# Patient Record
Sex: Female | Born: 1970 | Race: Black or African American | Hispanic: No | Marital: Single | Smoking: Never smoker
Health system: Southern US, Community
[De-identification: ages and names within clinical notes are randomized; demographics above are authoritative.]

## PROBLEM LIST (undated history)

## (undated) HISTORY — PX: TUBAL LIGATION: SHX77

---

## 1997-02-26 ENCOUNTER — Ambulatory Visit (HOSPITAL_COMMUNITY): Admission: RE | Admit: 1997-02-26 | Discharge: 1997-02-26 | Payer: Self-pay | Admitting: *Deleted

## 1997-06-23 ENCOUNTER — Emergency Department (HOSPITAL_COMMUNITY): Admission: EM | Admit: 1997-06-23 | Discharge: 1997-06-23 | Payer: Self-pay | Admitting: *Deleted

## 1997-09-21 ENCOUNTER — Ambulatory Visit (HOSPITAL_COMMUNITY): Admission: RE | Admit: 1997-09-21 | Discharge: 1997-09-21 | Payer: Self-pay | Admitting: *Deleted

## 1997-10-13 ENCOUNTER — Emergency Department (HOSPITAL_COMMUNITY): Admission: EM | Admit: 1997-10-13 | Discharge: 1997-10-13 | Payer: Self-pay | Admitting: Emergency Medicine

## 1999-01-08 ENCOUNTER — Encounter: Admission: RE | Admit: 1999-01-08 | Discharge: 1999-01-08 | Payer: Self-pay | Admitting: Family Medicine

## 1999-01-08 ENCOUNTER — Other Ambulatory Visit: Admission: RE | Admit: 1999-01-08 | Discharge: 1999-02-04 | Payer: Self-pay | Admitting: *Deleted

## 1999-01-22 ENCOUNTER — Encounter: Admission: RE | Admit: 1999-01-22 | Discharge: 1999-01-22 | Payer: Self-pay | Admitting: Family Medicine

## 1999-06-04 ENCOUNTER — Encounter: Admission: RE | Admit: 1999-06-04 | Discharge: 1999-06-04 | Payer: Self-pay | Admitting: Family Medicine

## 1999-06-05 ENCOUNTER — Encounter: Payer: Self-pay | Admitting: Sports Medicine

## 1999-06-05 ENCOUNTER — Encounter: Admission: RE | Admit: 1999-06-05 | Discharge: 1999-06-05 | Payer: Self-pay | Admitting: *Deleted

## 1999-11-25 ENCOUNTER — Encounter: Admission: RE | Admit: 1999-11-25 | Discharge: 1999-11-25 | Payer: Self-pay | Admitting: Sports Medicine

## 2000-03-11 ENCOUNTER — Encounter: Admission: RE | Admit: 2000-03-11 | Discharge: 2000-03-11 | Payer: Self-pay | Admitting: Family Medicine

## 2000-03-11 ENCOUNTER — Other Ambulatory Visit: Admission: RE | Admit: 2000-03-11 | Discharge: 2000-03-11 | Payer: Self-pay | Admitting: Family Medicine

## 2000-10-26 ENCOUNTER — Other Ambulatory Visit: Admission: RE | Admit: 2000-10-26 | Discharge: 2000-10-26 | Payer: Self-pay | Admitting: Family Medicine

## 2000-10-27 ENCOUNTER — Encounter: Admission: RE | Admit: 2000-10-27 | Discharge: 2000-10-27 | Payer: Self-pay | Admitting: Family Medicine

## 2001-02-09 ENCOUNTER — Encounter: Admission: RE | Admit: 2001-02-09 | Discharge: 2001-02-09 | Payer: Self-pay | Admitting: Family Medicine

## 2001-04-15 ENCOUNTER — Encounter: Admission: RE | Admit: 2001-04-15 | Discharge: 2001-04-15 | Payer: Self-pay | Admitting: Family Medicine

## 2001-04-21 ENCOUNTER — Encounter: Admission: RE | Admit: 2001-04-21 | Discharge: 2001-04-21 | Payer: Self-pay | Admitting: Family Medicine

## 2001-05-26 ENCOUNTER — Encounter: Admission: RE | Admit: 2001-05-26 | Discharge: 2001-05-26 | Payer: Self-pay | Admitting: Family Medicine

## 2002-01-30 ENCOUNTER — Other Ambulatory Visit: Admission: RE | Admit: 2002-01-30 | Discharge: 2002-01-30 | Payer: Self-pay | Admitting: Family Medicine

## 2002-01-30 ENCOUNTER — Encounter: Admission: RE | Admit: 2002-01-30 | Discharge: 2002-01-30 | Payer: Self-pay | Admitting: Family Medicine

## 2002-02-10 ENCOUNTER — Encounter: Admission: RE | Admit: 2002-02-10 | Discharge: 2002-02-10 | Payer: Self-pay | Admitting: Sports Medicine

## 2002-02-10 ENCOUNTER — Encounter: Payer: Self-pay | Admitting: Sports Medicine

## 2002-05-10 ENCOUNTER — Encounter: Admission: RE | Admit: 2002-05-10 | Discharge: 2002-05-10 | Payer: Self-pay | Admitting: Sports Medicine

## 2002-08-21 ENCOUNTER — Encounter: Admission: RE | Admit: 2002-08-21 | Discharge: 2002-08-21 | Payer: Self-pay | Admitting: Sports Medicine

## 2002-10-05 ENCOUNTER — Encounter: Admission: RE | Admit: 2002-10-05 | Discharge: 2002-10-05 | Payer: Self-pay | Admitting: Family Medicine

## 2002-11-14 ENCOUNTER — Encounter: Admission: RE | Admit: 2002-11-14 | Discharge: 2002-11-14 | Payer: Self-pay | Admitting: Sports Medicine

## 2002-12-29 ENCOUNTER — Encounter: Admission: RE | Admit: 2002-12-29 | Discharge: 2002-12-29 | Payer: Self-pay | Admitting: Sports Medicine

## 2003-03-07 ENCOUNTER — Emergency Department (HOSPITAL_COMMUNITY): Admission: EM | Admit: 2003-03-07 | Discharge: 2003-03-07 | Payer: Self-pay | Admitting: Family Medicine

## 2004-02-10 ENCOUNTER — Emergency Department (HOSPITAL_COMMUNITY): Admission: EM | Admit: 2004-02-10 | Discharge: 2004-02-10 | Payer: Self-pay | Admitting: Family Medicine

## 2004-02-11 ENCOUNTER — Emergency Department (HOSPITAL_COMMUNITY): Admission: EM | Admit: 2004-02-11 | Discharge: 2004-02-11 | Payer: Self-pay | Admitting: Emergency Medicine

## 2005-06-26 ENCOUNTER — Ambulatory Visit: Payer: Self-pay | Admitting: Family Medicine

## 2005-06-29 ENCOUNTER — Encounter: Admission: RE | Admit: 2005-06-29 | Discharge: 2005-06-29 | Payer: Self-pay | Admitting: Sports Medicine

## 2005-06-29 ENCOUNTER — Ambulatory Visit: Payer: Self-pay | Admitting: Family Medicine

## 2005-08-12 ENCOUNTER — Encounter (INDEPENDENT_AMBULATORY_CARE_PROVIDER_SITE_OTHER): Payer: Self-pay | Admitting: *Deleted

## 2005-08-25 ENCOUNTER — Other Ambulatory Visit: Admission: RE | Admit: 2005-08-25 | Discharge: 2005-08-25 | Payer: Self-pay | Admitting: Family Medicine

## 2005-08-25 ENCOUNTER — Ambulatory Visit: Payer: Self-pay | Admitting: Sports Medicine

## 2006-03-11 DIAGNOSIS — L708 Other acne: Secondary | ICD-10-CM | POA: Insufficient documentation

## 2006-03-11 DIAGNOSIS — D259 Leiomyoma of uterus, unspecified: Secondary | ICD-10-CM | POA: Insufficient documentation

## 2006-03-11 DIAGNOSIS — F17200 Nicotine dependence, unspecified, uncomplicated: Secondary | ICD-10-CM | POA: Insufficient documentation

## 2006-03-12 ENCOUNTER — Encounter (INDEPENDENT_AMBULATORY_CARE_PROVIDER_SITE_OTHER): Payer: Self-pay | Admitting: *Deleted

## 2007-01-04 ENCOUNTER — Encounter: Payer: Self-pay | Admitting: *Deleted

## 2007-01-31 ENCOUNTER — Encounter: Payer: Self-pay | Admitting: *Deleted

## 2007-12-15 ENCOUNTER — Emergency Department (HOSPITAL_COMMUNITY): Admission: EM | Admit: 2007-12-15 | Discharge: 2007-12-15 | Payer: Self-pay | Admitting: Emergency Medicine

## 2008-06-22 ENCOUNTER — Emergency Department (HOSPITAL_COMMUNITY): Admission: EM | Admit: 2008-06-22 | Discharge: 2008-06-22 | Payer: Self-pay | Admitting: Emergency Medicine

## 2008-12-04 ENCOUNTER — Encounter: Payer: Self-pay | Admitting: Family Medicine

## 2009-04-04 ENCOUNTER — Encounter: Payer: Self-pay | Admitting: *Deleted

## 2009-08-07 ENCOUNTER — Ambulatory Visit: Payer: Self-pay | Admitting: Obstetrics and Gynecology

## 2009-08-08 ENCOUNTER — Encounter: Admission: RE | Admit: 2009-08-08 | Discharge: 2009-08-08 | Payer: Self-pay | Admitting: Family Medicine

## 2009-08-08 ENCOUNTER — Encounter: Payer: Self-pay | Admitting: Obstetrics and Gynecology

## 2009-08-08 LAB — CONVERTED CEMR LAB
HCT: 40.4 % (ref 36.0–46.0)
Hemoglobin: 13 g/dL (ref 12.0–15.0)
MCHC: 32.2 g/dL (ref 30.0–36.0)
MCV: 91.4 fL (ref 78.0–100.0)
RBC: 4.42 M/uL (ref 3.87–5.11)
WBC: 4.1 10*3/uL (ref 4.0–10.5)

## 2010-02-02 ENCOUNTER — Encounter: Payer: Self-pay | Admitting: Family Medicine

## 2010-02-11 NOTE — Miscellaneous (Signed)
Summary: DO NOT RESCHEDULE!!   Clinical Lists Changes

## 2010-10-17 LAB — POCT URINALYSIS DIP (DEVICE)
Ketones, ur: 15 mg/dL — AB
Specific Gravity, Urine: 1.025 (ref 1.005–1.030)
pH: 6.5 (ref 5.0–8.0)

## 2014-11-16 ENCOUNTER — Emergency Department (HOSPITAL_BASED_OUTPATIENT_CLINIC_OR_DEPARTMENT_OTHER)
Admission: EM | Admit: 2014-11-16 | Discharge: 2014-11-16 | Disposition: A | Discharge status: Home / Self Care | Payer: No Typology Code available for payment source | Attending: Emergency Medicine | Admitting: Emergency Medicine

## 2014-11-16 ENCOUNTER — Encounter (HOSPITAL_BASED_OUTPATIENT_CLINIC_OR_DEPARTMENT_OTHER): Payer: Self-pay | Admitting: *Deleted

## 2014-11-16 DIAGNOSIS — Z880 Allergy status to penicillin: Secondary | ICD-10-CM | POA: Insufficient documentation

## 2014-11-16 DIAGNOSIS — H18822 Corneal disorder due to contact lens, left eye: Secondary | ICD-10-CM | POA: Insufficient documentation

## 2014-11-16 MED ORDER — POLYMYXIN B-TRIMETHOPRIM 10000-0.1 UNIT/ML-% OP SOLN: 1.0000 [drp] | OPHTHALMIC | Status: AC

## 2014-11-16 MED ORDER — FLUORESCEIN SODIUM 1 MG OP STRP
1.0000 | ORAL_STRIP | Freq: Once | OPHTHALMIC | Status: AC
  Administered 2014-11-16: 1 via OPHTHALMIC
  Filled 2014-11-16: qty 1

## 2014-11-16 MED ORDER — TETRACAINE HCL 0.5 % OP SOLN
1.0000 [drp] | Freq: Once | OPHTHALMIC | Status: AC
  Administered 2014-11-16: 1 [drp] via OPHTHALMIC

## 2014-11-16 MED ORDER — TETRACAINE HCL 0.5 % OP SOLN
OPHTHALMIC | Status: AC
  Filled 2014-11-16: qty 2

## 2014-11-16 NOTE — ED Notes (Signed)
Pt. Feels she may have had an eye lash in her eye yesterday with her contacts in and after taking her contacts out she messed with her eye now she has irritation and pain in the L eye.

## 2014-11-16 NOTE — ED Provider Notes (Signed)
CSN: [id number]     Arrival date & time 11/16/14  1539 History   First MD Initiated Contact with Patient 11/16/14 1552     Chief complaint: Eye discomfort HPI Patient presents to the emergency room for evaluation of discomfort in her left eye. Patient wears contact lenses. Yesterday she felt like she had an eyelash in her left upper eyelid. She tried to remove the eyelash. Since that time she's had discomfort in the left upper eyelid. Her eye is somewhat red and irritated. She has noticed increased tearing. She denies any difficulty with her vision. Patient continues to wear her contact lenses. No past medical history on file. Past Surgical History  Procedure Laterality Date  . Tubal ligation     No family history on file. Social History  Substance Use Topics  . Smoking status: Not on file  . Smokeless tobacco: Not on file  . Alcohol Use: Not on file   OB History    No data available     Review of Systems  All other systems reviewed and are negative.     Allergies  Dilaudid; Morphine and related; and Penicillins  Home Medications   Prior to Admission medications   Medication Sig Start Date End Date Taking? Authorizing Provider  trimethoprim-polymyxin b (POLYTRIM) ophthalmic solution Place 1 drop into the left eye every 4 (four) hours. 11/16/14   Dorie Rank, MD   BP 159/100 mmHg  Pulse 88  Temp(Src) 98 F (36.7 C) (Oral)  Resp 17  Ht 5\' 8"  (1.727 m)  Wt 157 lb (71.215 kg)  BMI 23.88 kg/m2  SpO2 100%  LMP 10/16/2014 Physical Exam  Constitutional: She appears well-developed and well-nourished. No distress.  HENT:  Head: Normocephalic and atraumatic.  Right Ear: External ear normal.  Left Ear: External ear normal.  Eyes: Conjunctivae and EOM are normal. Right eye exhibits no discharge. Left eye exhibits no discharge. No scleral icterus.  Patient has a retained contact lens in the left upper eyelid, no ulcerations with fluorescein staining, lids were everted and no  foreign body noted other than the contact lens  Neck: Neck supple. No tracheal deviation present.  Cardiovascular: Normal rate.   Pulmonary/Chest: Effort normal. No stridor. No respiratory distress.  Musculoskeletal: She exhibits no edema.  Neurological: She is alert. Cranial nerve deficit: no gross deficits.  Skin: Skin is warm and dry. No rash noted.  Psychiatric: She has a normal mood and affect.  Nursing note and vitals reviewed.   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 It was not centered over her iris. Patient was able to remove her contact lens. I do not see any evidence of ulceration or obvious abrasion. She does not have any evidence of persistent foreign body. Patient's eyes were irrigated. I will discharge her home on antibiotics. I instructed her to not wear her contact lenses for at least the next week. Follow up with her eye doctor.    Dorie Rank, MD 11/16/14 [phone]

## 2016-03-01 ENCOUNTER — Encounter (HOSPITAL_BASED_OUTPATIENT_CLINIC_OR_DEPARTMENT_OTHER): Payer: Self-pay | Admitting: Emergency Medicine

## 2016-03-01 ENCOUNTER — Emergency Department (HOSPITAL_BASED_OUTPATIENT_CLINIC_OR_DEPARTMENT_OTHER)
Admission: EM | Admit: 2016-03-01 | Discharge: 2016-03-01 | Disposition: A | Discharge status: Home / Self Care | Payer: Self-pay | Attending: Physician Assistant | Admitting: Physician Assistant

## 2016-03-01 ENCOUNTER — Emergency Department (HOSPITAL_BASED_OUTPATIENT_CLINIC_OR_DEPARTMENT_OTHER): Payer: Self-pay

## 2016-03-01 DIAGNOSIS — J111 Influenza due to unidentified influenza virus with other respiratory manifestations: Secondary | ICD-10-CM | POA: Insufficient documentation

## 2016-03-01 LAB — COMPREHENSIVE METABOLIC PANEL
ALK PHOS: 114 U/L (ref 38–126)
ALT: 24 U/L (ref 14–54)
AST: 38 U/L (ref 15–41)
Albumin: 4.2 g/dL (ref 3.5–5.0)
Anion gap: 13 (ref 5–15)
BUN: 9 mg/dL (ref 6–20)
CALCIUM: 8.9 mg/dL (ref 8.9–10.3)
CHLORIDE: 104 mmol/L (ref 101–111)
CO2: 17 mmol/L — ABNORMAL LOW (ref 22–32)
CREATININE: 0.86 mg/dL (ref 0.44–1.00)
Glucose, Bld: 113 mg/dL — ABNORMAL HIGH (ref 65–99)
Potassium: 3.5 mmol/L (ref 3.5–5.1)
Sodium: 134 mmol/L — ABNORMAL LOW (ref 135–145)
Total Bilirubin: 0.7 mg/dL (ref 0.3–1.2)
Total Protein: 8.1 g/dL (ref 6.5–8.1)

## 2016-03-01 LAB — URINALYSIS, ROUTINE W REFLEX MICROSCOPIC
Bilirubin Urine: NEGATIVE
GLUCOSE, UA: NEGATIVE mg/dL
KETONES UR: 40 mg/dL — AB
LEUKOCYTES UA: NEGATIVE
Nitrite: NEGATIVE
PROTEIN: NEGATIVE mg/dL
Specific Gravity, Urine: 1.009 (ref 1.005–1.030)
pH: 6 (ref 5.0–8.0)

## 2016-03-01 LAB — CBC
HCT: 41.5 % (ref 36.0–46.0)
Hemoglobin: 13.7 g/dL (ref 12.0–15.0)
MCH: 29.3 pg (ref 26.0–34.0)
MCHC: 33 g/dL (ref 30.0–36.0)
MCV: 88.9 fL (ref 78.0–100.0)
PLATELETS: 181 10*3/uL (ref 150–400)
RBC: 4.67 MIL/uL (ref 3.87–5.11)
RDW: 12.7 % (ref 11.5–15.5)
WBC: 7.9 10*3/uL (ref 4.0–10.5)

## 2016-03-01 LAB — URINALYSIS, MICROSCOPIC (REFLEX): WBC, UA: NONE SEEN WBC/hpf (ref 0–5)

## 2016-03-01 LAB — LIPASE, BLOOD: Lipase: 30 U/L (ref 11–51)

## 2016-03-01 MED ORDER — ONDANSETRON HCL 4 MG/2ML IJ SOLN
4.0000 mg | Freq: Once | INTRAMUSCULAR | Status: AC | PRN
  Administered 2016-03-01: 4 mg via INTRAVENOUS
  Filled 2016-03-01: qty 2

## 2016-03-01 MED ORDER — ACETAMINOPHEN 325 MG PO TABS
650.0000 mg | ORAL_TABLET | Freq: Once | ORAL | Status: AC
  Administered 2016-03-01: 650 mg via ORAL
  Filled 2016-03-01: qty 2

## 2016-03-01 MED ORDER — SODIUM CHLORIDE 0.9 % IV BOLUS (SEPSIS)
1000.0000 mL | Freq: Once | INTRAVENOUS | Status: AC
  Administered 2016-03-01: 1000 mL via INTRAVENOUS

## 2016-03-01 MED ORDER — IBUPROFEN 400 MG PO TABS
600.0000 mg | ORAL_TABLET | Freq: Once | ORAL | Status: AC
  Administered 2016-03-01: 600 mg via ORAL
  Filled 2016-03-01: qty 1

## 2016-03-01 MED ORDER — ONDANSETRON HCL 4 MG PO TABS: 4.0000 mg | ORAL_TABLET | Freq: Three times a day (TID) | ORAL | 0 refills | Status: AC | PRN

## 2016-03-01 NOTE — ED Triage Notes (Addendum)
Patient reports that she has had N/V/D and fever since Thursday  - patient states that her tmax was 104 - Taking tylenol and motrin at home  -  The patient reports that generalized abdominal pian since Thursday

## 2016-03-01 NOTE — ED Notes (Signed)
Pt alert, NAD, calm, interactive, resps e/u, speaking in clear complete sentences, skin W&D, no dyspnea noted, IVF infusing, VSS.

## 2016-03-01 NOTE — ED Notes (Signed)
Up to b/r, steady gait. Alert, NAD, calm, interactive, resps e/u, speaking in clear complete sentences, no dyspnea noted, skin W&D, VSS, (denies: pain, sob, nausea, dizziness or visual changes), EDP into room. Family at [facility].

## 2016-03-01 NOTE — ED Notes (Signed)
EDP at BS 

## 2016-03-01 NOTE — Discharge Instructions (Signed)
We think that you have flew based on your symptoms. Please stay at home, rest, stay hydrated, use Zofran as needed for nausea. Do not return to work until you are better.

## 2016-03-01 NOTE — ED Notes (Signed)
Pt given d/c instructions as per chart. Verbalizes understanding. No questions. Rx x 1 

## 2016-03-01 NOTE — ED Notes (Signed)
Tolerating PO fluids, denies nausea, HA remains, but improving. 8/10.

## 2016-03-01 NOTE — ED Provider Notes (Signed)
[facility] Provider Note   CSN: [id number] Arrival date & time: 03/01/16  1909   By signing my name below, I, Soijett Blue, attest that this documentation has been prepared under the direction and in the presence of Tanelle Lanzo Julio Alm, MD. Electronically Signed: Soijett Blue, ED Scribe. 03/01/16. 7:56 PM.  History   Chief Complaint Chief Complaint  Patient presents with  . Abdominal Pain    HPI Morgan Mosley is a 46 y.o. female who presents to the Emergency Department complaining of generalized abdominal pain onset 3 days ago. Pt reports associated vomiting, nausea, diarrhea, dry cough, max fever of 104, and HA. Pt has tried ibuprofen, sudafed, robitussin, and tylenol with no relief of her symptoms. She denies chills and any other symptoms.  The history is provided by the patient. No language interpreter was used.    History reviewed. No pertinent past medical history.  Patient Active Problem List   Diagnosis Date Noted  . 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 TOBACCO DEPENDENCE 03/11/2006  . 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 TUBAL LIGATION    . TUBAL LIGATION      OB History    No data available       Home Medications    Prior to Admission medications   Medication Sig Start Date End Date Taking? Authorizing Provider  trimethoprim-polymyxin b (POLYTRIM) ophthalmic solution Place 1 drop into the left eye every 4 (four) hours. 11/16/14   Dorie Rank, MD    Family History History reviewed. No pertinent family history.  Social History Social History  Substance Use Topics  . Smoking status: Never Smoker  . Smokeless tobacco: Never Used  . Alcohol use No     Allergies   Dilaudid [hydromorphone]; Morphine and related; and Penicillins   Review of Systems Review of Systems  Constitutional: Positive for fever. Negative for chills.  Respiratory: Positive for cough.   Gastrointestinal: Positive for abdominal pain,  diarrhea, nausea and vomiting.  Neurological: Positive for headaches.     Physical Exam Updated Vital Signs BP 145/98 (BP Location: Right Arm)   Pulse 116   Temp 102.8 F (39.3 C) (Oral)   Resp 22   Ht 5\' 8"  (1.727 m)   Wt 155 lb (70.3 kg)   LMP 03/01/2016   SpO2 98%   BMI 23.57 kg/m   Physical Exam  Constitutional: She is oriented to person, place, and time. She appears well-developed and well-nourished. No distress.  HENT:  Head: Normocephalic and atraumatic.  Eyes: EOM are normal.  Neck: Neck supple.  Cardiovascular: Normal rate, regular rhythm, normal heart sounds and intact distal pulses.  Exam reveals no friction rub.   No murmur heard. Pulmonary/Chest: Effort normal. Tachypnea noted. No respiratory distress. She has no wheezes. She has no rales.  Mildly tachypneic.  Abdominal: Soft. She exhibits no distension. There is no tenderness.  Musculoskeletal: Normal range of motion.  Neurological: She is alert and oriented to person, place, and time.  Skin: Skin is warm and dry.  Psychiatric: She has a normal mood and affect. Her behavior is normal.  Nursing note and vitals reviewed.    ED Treatments / Results  DIAGNOSTIC STUDIES: Oxygen Saturation is 98% on RA, nl by my interpretation.    COORDINATION OF CARE: 7:56 PM Discussed treatment plan with pt at bedside which includes zofran, labs, UA, CXR, and pt agreed to plan.   Labs (all labs ordered are listed, but only abnormal  results are displayed) Labs Reviewed  COMPREHENSIVE METABOLIC PANEL - Abnormal; Notable for the following:       Result Value   Sodium 134 (*)    CO2 17 (*)    Glucose, Bld 113 (*)    All other components within normal limits  URINALYSIS, ROUTINE W REFLEX MICROSCOPIC - Abnormal; Notable for the following:    Hgb urine dipstick MODERATE (*)    Ketones, ur 40 (*)    All other components within normal limits  URINALYSIS, MICROSCOPIC (REFLEX) - Abnormal; Notable for the following:     Bacteria, UA RARE (*)    Squamous Epithelial / LPF 0-5 (*)    All other components within normal limits  LIPASE, BLOOD  CBC    Radiology Dg Chest 2 View  Result Date: 03/01/2016 CLINICAL DATA:  Fever with nausea vomiting and right sided headache. EXAM: CHEST  2 VIEW COMPARISON:  06/29/2005. FINDINGS: The lungs are clear wiithout focal pneumonia, edema, pneumothorax or pleural effusion. Interstitial markings are diffusely coarsened with chronic features. The cardiopericardial silhouette is within normal limits for size. The visualized bony structures of the thorax are intact. IMPRESSION: No active cardiopulmonary disease. Electronically Signed   By: Misty Stanley M.D.   On: 03/01/2016 20:40    Procedures Procedures (including critical care time)  Medications Ordered in ED Medications  ondansetron (ZOFRAN) injection 4 mg (not administered)     Initial Impression / Assessment and Plan / ED Course  I have reviewed the triage vital signs and the nursing notes.  Pertinent labs & imaging results that were available during my care of the patient were reviewed by me and considered in my medical decision making (see chart for details).    I personally performed the services described in this documentation, which was scribed in my presence. The recorded information has been reviewed and is accurate.   Patient is a 46 year old female presenting with flulike symptoms. Patient's had symptoms for the last 3 days. Will diagnose presumptively for flu. Howver, outside Tamiflu window. Patient received fluids as well as ibuprofen andTylenol to help with fever and dehydration. Patient is able to eat and drink now and we will discharge with symptomatic care.  Final Clinical Impressions(s) / ED Diagnoses   Final diagnoses:  None    New Prescriptions New Prescriptions   No medications on file     Tanvi Gatling Julio Alm, MD 03/01/16 2158

## 2017-11-17 IMAGING — DX DG CHEST 2V
2 series · 2 of 2 positions shown · non-contrast
Comparison: 06/29/2005.

CLINICAL DATA: Fever with nausea vomiting and right sided headache.

EXAM:
CHEST  2 VIEW

[chest pa]
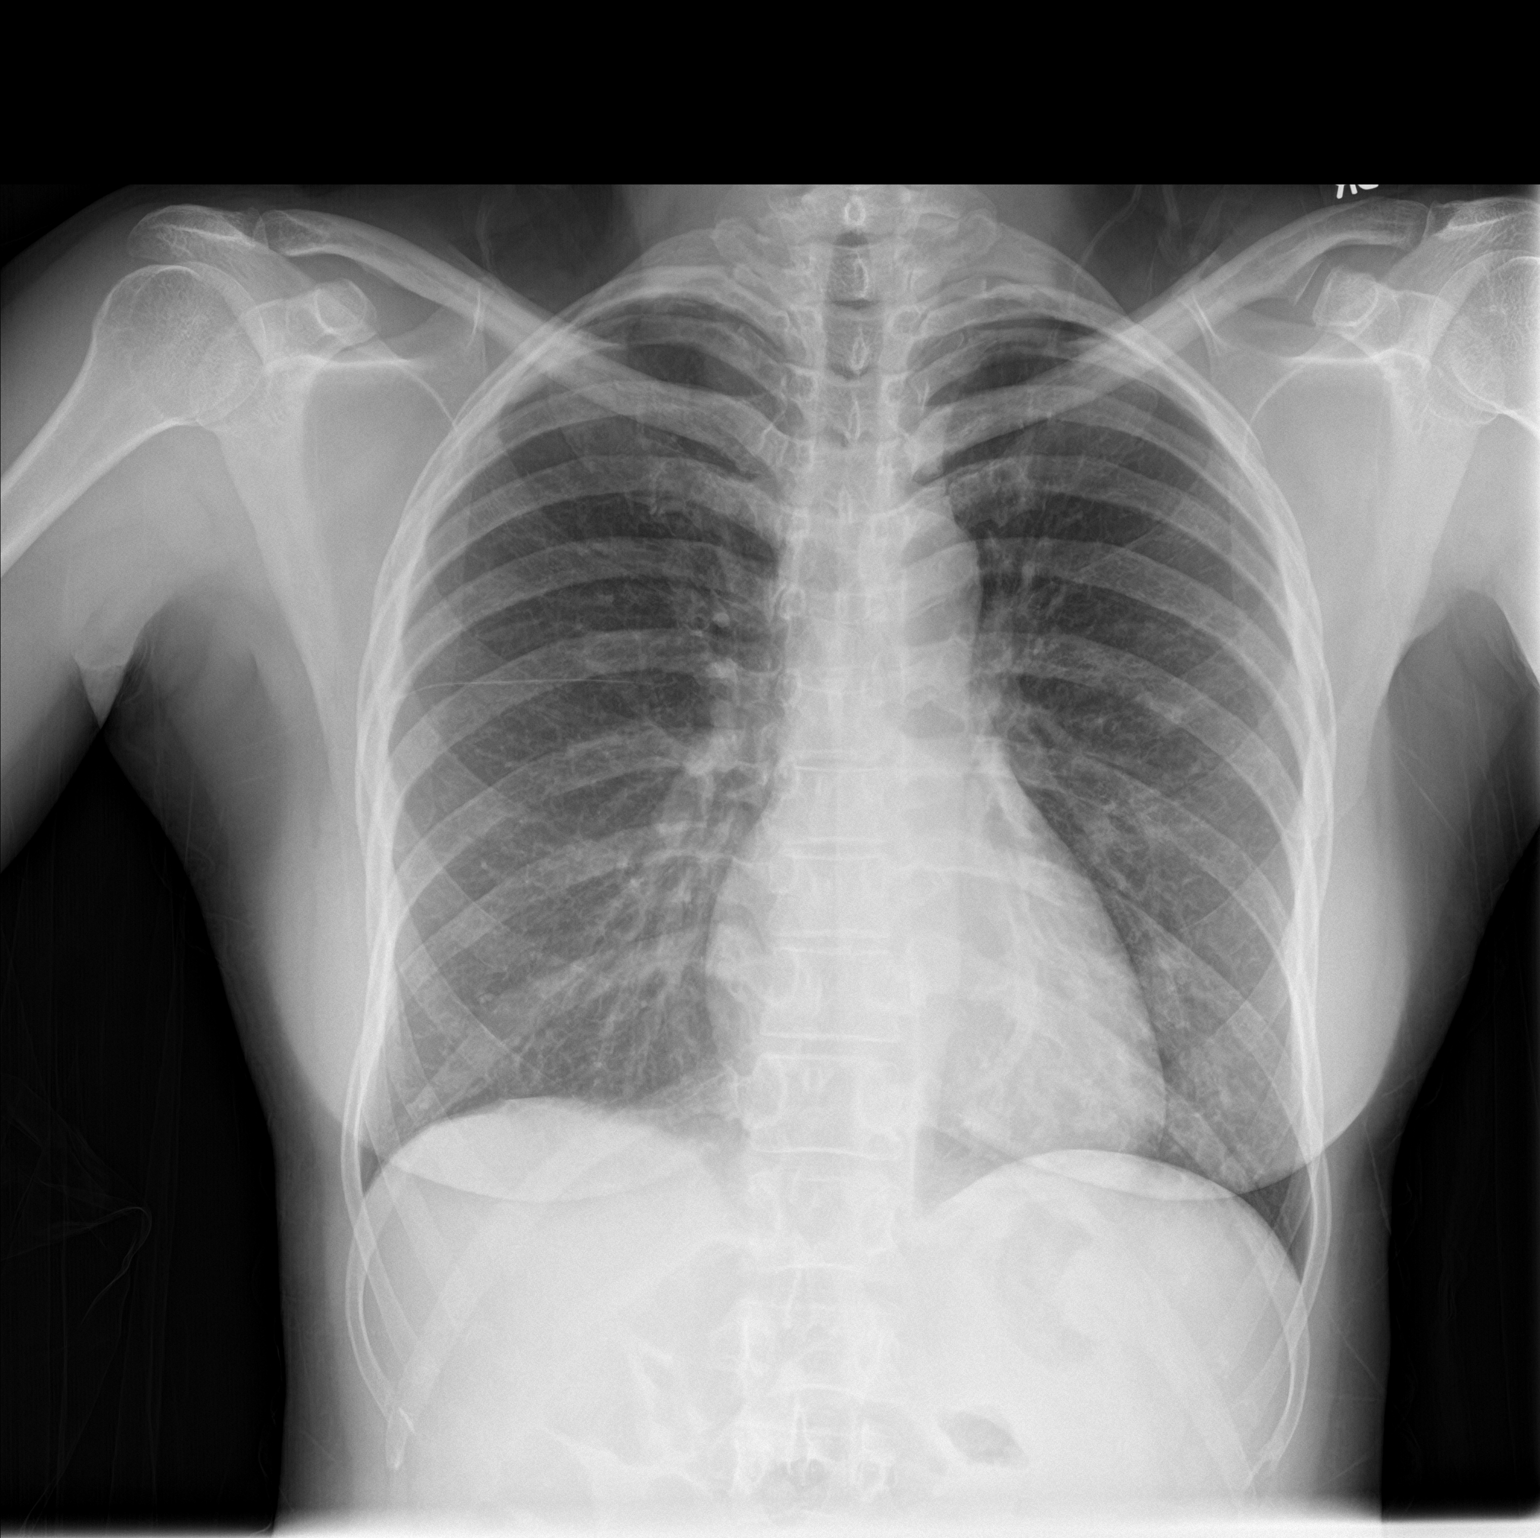

[chest lat]
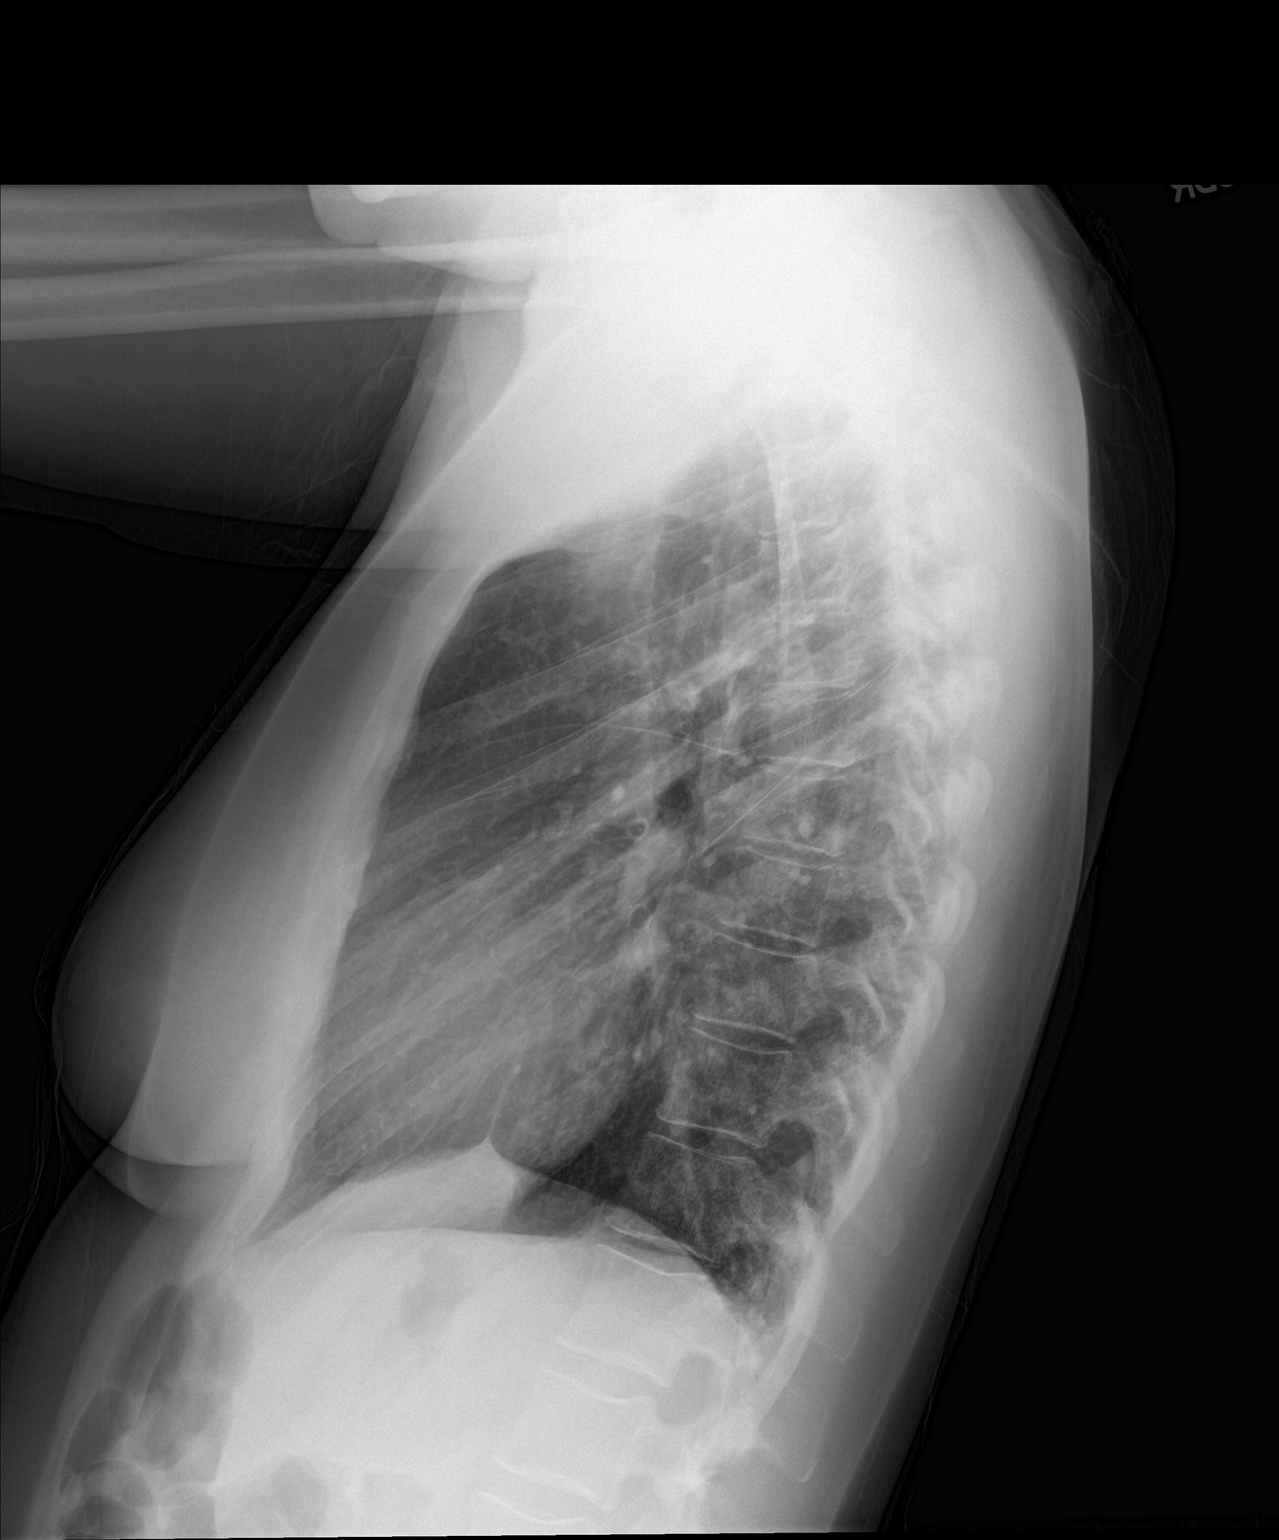

[2 of 2 positions shown; findings below may reference images not displayed]

FINDINGS: The lungs are clear wiithout focal pneumonia, edema, pneumothorax or
pleural effusion. Interstitial markings are diffusely coarsened with
chronic features. The cardiopericardial silhouette is within normal
limits for size. The visualized bony structures of the thorax are
intact.
IMPRESSION: No active cardiopulmonary disease.
